# Patient Record
Sex: Male | Born: 1990 | Race: Black or African American | Hispanic: No | Marital: Single | Smoking: Never smoker
Health system: Southern US, Community
[De-identification: ages and names within clinical notes are randomized; demographics above are authoritative.]

## PROBLEM LIST (undated history)

## (undated) ENCOUNTER — Emergency Department (HOSPITAL_COMMUNITY): Admission: EM

## (undated) DIAGNOSIS — S7290XA Unspecified fracture of unspecified femur, initial encounter for closed fracture: Secondary | ICD-10-CM

## (undated) HISTORY — PX: FEMUR FRACTURE SURGERY: SHX633

---
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
## 2018-02-22 ENCOUNTER — Encounter (HOSPITAL_COMMUNITY): Payer: Self-pay | Admitting: Emergency Medicine

## 2018-02-22 ENCOUNTER — Other Ambulatory Visit: Payer: Self-pay

## 2018-02-22 DIAGNOSIS — G8911 Acute pain due to trauma: Secondary | ICD-10-CM | POA: Insufficient documentation

## 2018-02-22 DIAGNOSIS — M79652 Pain in left thigh: Secondary | ICD-10-CM | POA: Insufficient documentation

## 2018-02-22 DIAGNOSIS — M79651 Pain in right thigh: Secondary | ICD-10-CM | POA: Insufficient documentation

## 2018-02-22 DIAGNOSIS — Z8781 Personal history of (healed) traumatic fracture: Secondary | ICD-10-CM | POA: Insufficient documentation
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
## 2018-02-23 ENCOUNTER — Emergency Department (HOSPITAL_COMMUNITY)
Admission: EM | Admit: 2018-02-23 | Discharge: 2018-02-23 | Disposition: A | Discharge status: Home / Self Care | Payer: Self-pay | Attending: Emergency Medicine | Admitting: Emergency Medicine

## 2018-02-23 DIAGNOSIS — Z8781 Personal history of (healed) traumatic fracture: Secondary | ICD-10-CM

## 2018-02-23 DIAGNOSIS — G8921 Chronic pain due to trauma: Secondary | ICD-10-CM

## 2018-02-23 HISTORY — DX: Unspecified fracture of unspecified femur, initial encounter for closed fracture: S72.90XA

## 2018-02-23 MED ORDER — OXYCODONE-ACETAMINOPHEN 5-325 MG PO TABS
2.0000 | ORAL_TABLET | Freq: Once | ORAL | Status: AC
  Administered 2018-02-23: 2 via ORAL
  Filled 2018-02-23: qty 2

## 2018-02-23 MED ORDER — NAPROXEN 500 MG PO TABS: 500.0000 mg | ORAL_TABLET | Freq: Two times a day (BID) | ORAL | 0 refills | Status: AC

## 2018-02-23 MED ORDER — ACETAMINOPHEN ER 650 MG PO TBCR: 650.0000 mg | EXTENDED_RELEASE_TABLET | Freq: Three times a day (TID) | ORAL | 0 refills | Status: DC | PRN

## 2018-02-23 MED ORDER — OXYCODONE-ACETAMINOPHEN 5-325 MG PO TABS: 1.0000 | ORAL_TABLET | Freq: Three times a day (TID) | ORAL | 0 refills | Status: DC | PRN

## 2018-02-23 NOTE — Discharge Instructions (Signed)
No weight bearing for now. See the Orthopedist for further evaluation.

## 2018-02-26 NOTE — ED Provider Notes (Signed)
[facility]-EMERGENCY DEPT Provider Note   CSN: [id number] Arrival date & time: 02/22/18  2308     History   Chief Complaint Chief Complaint  Patient presents with  . Leg Pain    HPI Ryan Huerta is a 27 y.o. male.  HPI 27 year old male comes in with chief complaint of leg pain.  Patient reports that he sustained bilateral femur fracture a few weeks ago.  Patient was advised by his orthopedist to start ambulating-weightbearing as tolerated.  Patient states that now he has increasing pain with ambulation.  Patient denies any new falls or trauma.  Patient is from out of town and does not have any strong pain medications with him anymore.  Patient has tried over-the-counter pain medicine without significant relief.  Past Medical History:  Diagnosis Date  . Femur fracture (HCC)     There are no active problems to display for this patient.   Past Surgical History:  Procedure Laterality Date  . FEMUR FRACTURE SURGERY Left   . FEMUR FRACTURE SURGERY Right         Home Medications    Prior to Admission medications   Medication Sig Start Date End Date Taking? Authorizing Provider  acetaminophen (TYLENOL 8 HOUR) 650 MG CR tablet Take 1 tablet (650 mg total) by mouth every 8 (eight) hours as needed. 02/23/18   Derwood Kaplan, MD  naproxen (NAPROSYN) 500 MG tablet Take 1 tablet (500 mg total) by mouth 2 (two) times daily with a meal. 02/23/18   Derwood Kaplan, MD  oxyCODONE-acetaminophen (PERCOCET/ROXICET) 5-325 MG tablet Take 1 tablet by mouth every 8 (eight) hours as needed for severe pain. 02/23/18   Derwood Kaplan, MD    Family History History reviewed. No pertinent family history.  Social History Social History   Tobacco Use  . Smoking status: Never Smoker  . Smokeless tobacco: Never Used  Substance Use Topics  . Alcohol use: Not Currently  . Drug use: Not Currently     Allergies   Patient has no known allergies.   Review of  Systems Review of Systems  Constitutional: Positive for activity change.  Respiratory: Negative for shortness of breath.   Musculoskeletal: Positive for arthralgias and myalgias.  Hematological: Does not bruise/bleed easily.     Physical Exam Updated Vital Signs BP 127/82 (BP Location: Left Arm)   Pulse 76   Temp 98.5 F (36.9 C) (Oral)   Resp 18   SpO2 100%   Physical Exam  Constitutional: He is oriented to person, place, and time. He appears well-developed.  HENT:  Head: Atraumatic.  Neck: Neck supple.  Cardiovascular: Normal rate.  Pulmonary/Chest: Effort normal.  Musculoskeletal: He exhibits tenderness. He exhibits no edema or deformity.  Neurological: He is alert and oriented to person, place, and time.  Skin: Skin is warm.  Nursing note and vitals reviewed.   ED Treatments / Results  Labs (all labs ordered are listed, but only abnormal results are displayed) Labs Reviewed - No data to display  EKG None  Radiology No results found.  Procedures Procedures (including critical care time)  Medications Ordered in ED Medications  oxyCODONE-acetaminophen (PERCOCET/ROXICET) 5-325 MG per tablet 2 tablet (2 tablets Oral Given 02/23/18 0311)     Initial Impression / Assessment and Plan / ED Course  I have reviewed the triage vital signs and the nursing notes.  Pertinent labs & imaging results that were available during my care of the patient were reviewed by me and considered in my  medical decision making (see chart for details).     Patient comes in with bilateral leg pain.  Patient had a recent femur fracture and just started weightbearing as tolerated earlier in the week.  Now with ambulation is severe pain that is not responding to over-the-counter pain medicines.  Have advised him to follow-up with his orthopedist or the orthopedist in [place] for optimal care.  For now we will give him 6 Vicodin to go home.  Final Clinical Impressions(s) / ED Diagnoses    Final diagnoses:  History of femur fracture  Chronic pain due to injury    ED Discharge Orders        Ordered    acetaminophen (TYLENOL 8 HOUR) 650 MG CR tablet  Every 8 hours PRN     02/23/18 0309    naproxen (NAPROSYN) 500 MG tablet  2 times daily with meals     02/23/18 0309    oxyCODONE-acetaminophen (PERCOCET/ROXICET) 5-325 MG tablet  Every 8 hours PRN     02/23/18 0309       Derwood KaplanNanavati, Esteen Delpriore, MD 02/26/18 [id number]

## 2022-06-15 ENCOUNTER — Ambulatory Visit (INDEPENDENT_AMBULATORY_CARE_PROVIDER_SITE_OTHER): Payer: Self-pay | Admitting: Pharmacist

## 2022-06-15 ENCOUNTER — Other Ambulatory Visit (HOSPITAL_COMMUNITY)
Admission: RE | Admit: 2022-06-15 | Discharge: 2022-06-15 | Disposition: A | Discharge status: Home / Self Care | Payer: Self-pay | Source: Ambulatory Visit | Attending: Infectious Disease | Admitting: Infectious Disease

## 2022-06-15 ENCOUNTER — Other Ambulatory Visit: Payer: Self-pay

## 2022-06-15 DIAGNOSIS — Z202 Contact with and (suspected) exposure to infections with a predominantly sexual mode of transmission: Secondary | ICD-10-CM

## 2022-06-15 MED ORDER — DOXYCYCLINE HYCLATE 100 MG PO TABS: 100.0000 mg | ORAL_TABLET | Freq: Two times a day (BID) | ORAL | 0 refills | Status: DC

## 2022-06-15 NOTE — Progress Notes (Signed)
Date:  06/15/2022   HPI: Ryan Huerta is a 31 y.o. male who presents to the [facility] pharmacy clinic for STI testing.  Insured   []    Uninsured  [x]    There are no problems to display for this patient.   Patient's Medications  New Prescriptions   DOXYCYCLINE (VIBRA-TABS) 100 MG TABLET    Take 1 tablet (100 mg total) by mouth 2 (two) times daily.  Previous Medications   ACETAMINOPHEN (TYLENOL 8 HOUR) 650 MG CR TABLET    Take 1 tablet (650 mg total) by mouth every 8 (eight) hours as needed.   NAPROXEN (NAPROSYN) 500 MG TABLET    Take 1 tablet (500 mg total) by mouth 2 (two) times daily with a meal.   OXYCODONE-ACETAMINOPHEN (PERCOCET/ROXICET) 5-325 MG TABLET    Take 1 tablet by mouth every 8 (eight) hours as needed for severe pain.  Modified Medications   No medications on file  Discontinued Medications   No medications on file    Allergies: No Known Allergies  Past Medical History: Past Medical History:  Diagnosis Date   Femur fracture (HCC)     Social History: Social History   Socioeconomic History   Marital status: Single    Spouse name: Not on file   Number of children: Not on file   Years of education: Not on file   Highest education level: Not on file  Occupational History   Not on file  Tobacco Use   Smoking status: Never   Smokeless tobacco: Never  Substance and Sexual Activity   Alcohol use: Not Currently   Drug use: Not Currently   Sexual activity: Yes  Other Topics Concern   Not on file  Social History Narrative   Not on file   Social Determinants of Health   Financial Resource Strain: Not on file  Food Insecurity: Not on file  Transportation Needs: Not on file  Physical Activity: Not on file  Stress: Not on file  Social Connections: Not on file        No data to display          Labs:  SCr: No results found for: "CREATININE" HIV No results found for: "HIV" Hepatitis B No results found for: "HEPBSAB", "HEPBSAG",  "HEPBCAB" Hepatitis C No results found for: "HEPCAB", "HCVRNAPCRQN" Hepatitis A No results found for: "HAV" RPR and STI No results found for: "LABRPR", "RPRTITER"      No data to display          Assessment: Ryan Huerta presents to clinic today with his partner for STI testing. He states they are both exclusive at this time and that his last sexual partner was over 2 months ago. His partner recently experienced abdominal discomfort and tested positive for chlamydia. She states she is picking her prescription up today. They were sexually active yesterday, so I reviewed with them to abstain from any sexual activity until 1 week following completion of treatment. They verbalized understanding. Ryan Huerta denies any STI symptoms today. Will screen for HIV, syphilis, gonorrhea, and chlamydia. Screened patient for acute HIV symptoms such as fatigue, muscle aches, rash, sore throat, lymphadenopathy, headache, night sweats, nausea/vomiting/diarrhea, and fever. Will send in doxycycline x 7 days to [facility] for presumptive chlamydia infection. Educated [name] and his partner about PrEP which they would like to read more about before starting. Accepted condoms today. Will follow up on results and if they are interested in PrEP. He requested a call back regarding results even  if negative. Helped him set up MyChart today as well so he could see results should they result over the weekend or to message me if needed. Provided them with my card should they have more questions about any STIs or about PrEP.  Plan: Check HIV antibody, RPR, and urine/oral cytologies Start doxycycline 100mg  BID x 7 days for presumptive chlamydia Follow-up on test results   , PharmD, CPP, BCIDP Clinical Pharmacist Practitioner Infectious Diseases Clinical Pharmacist [facility] 06/15/2022, 9:48 AM

## 2022-06-16 LAB — CYTOLOGY, (ORAL, ANAL, URETHRAL) ANCILLARY ONLY
Chlamydia: NEGATIVE
Comment: NEGATIVE
Comment: NEGATIVE
Comment: NORMAL
Neisseria Gonorrhea: NEGATIVE
Trichomonas: NEGATIVE

## 2022-06-16 LAB — URINE CYTOLOGY ANCILLARY ONLY
Chlamydia: NEGATIVE
Comment: NEGATIVE
Comment: NEGATIVE
Comment: NORMAL
Neisseria Gonorrhea: NEGATIVE
Trichomonas: NEGATIVE

## 2022-06-16 LAB — HIV ANTIBODY (ROUTINE TESTING W REFLEX): HIV 1&2 Ab, 4th Generation: NONREACTIVE

## 2022-06-16 LAB — RPR: RPR Ser Ql: NONREACTIVE

## 2022-06-20 ENCOUNTER — Telehealth: Payer: Self-pay | Admitting: Pharmacist

## 2022-06-20 NOTE — Telephone Encounter (Signed)
LVM with patient stating lab results were negative and that he could call back with any questions.  Margarite Gouge, PharmD, CPP, BCIDP Clinical Pharmacist Practitioner Infectious Diseases Clinical Pharmacist [facility]

## 2022-07-06 ENCOUNTER — Emergency Department (HOSPITAL_COMMUNITY)
Admission: EM | Admit: 2022-07-06 | Discharge: 2022-07-06 | Disposition: A | Discharge status: Home / Self Care | Payer: BLUE CROSS/BLUE SHIELD | Attending: Emergency Medicine | Admitting: Emergency Medicine

## 2022-07-06 ENCOUNTER — Other Ambulatory Visit: Payer: Self-pay

## 2022-07-06 ENCOUNTER — Encounter (HOSPITAL_COMMUNITY): Payer: Self-pay

## 2022-07-06 DIAGNOSIS — K0889 Other specified disorders of teeth and supporting structures: Secondary | ICD-10-CM | POA: Diagnosis present

## 2022-07-06 MED ORDER — OXYCODONE-ACETAMINOPHEN 5-325 MG PO TABS: 1.0000 | ORAL_TABLET | Freq: Four times a day (QID) | ORAL | 0 refills | Status: DC | PRN

## 2022-07-06 MED ORDER — AMOXICILLIN-POT CLAVULANATE 875-125 MG PO TABS: 1.0000 | ORAL_TABLET | Freq: Two times a day (BID) | ORAL | 0 refills | Status: DC

## 2022-07-06 NOTE — Discharge Instructions (Addendum)
You were seen in the emergency department for dental pain.  As we discussed, you may be developing an infection around the tooth with your filling. I am giving you some pain medication and antibiotics. You will need to follow up with a dentist and I've attached a list of some in the area.

## 2022-07-06 NOTE — ED Triage Notes (Signed)
Reports left upper molar pain that started x 1 week.  Reports a filling in that tooth.  Reports it is now causing his lower teeth to hurt.

## 2022-07-07 NOTE — ED Provider Notes (Signed)
[facility] EMERGENCY DEPARTMENT Provider Note   CSN: [id number] Arrival date & time: 07/06/22  1549     History  Chief Complaint  Patient presents with   Dental Pain    Ryan Huerta is a 31 y.o. male who presents the emergency department for dental pain for the past week.  Patient reports pain along his left upper molar at the site of previous filling.  He states that the pain is often causing his left face as well as left lower teeth to hurt.  Has not been taking any medication for this.  Last saw dentist last year.  Does not have insurance, but is interested in finding a dentist in the area.   Dental Pain Associated symptoms: no fever        Home Medications Prior to Admission medications   Medication Sig Start Date End Date Taking? Authorizing Provider  amoxicillin-clavulanate (AUGMENTIN) 875-125 MG tablet Take 1 tablet by mouth every 12 (twelve) hours. 07/06/22  Yes Siyona Coto T, PA-C  oxyCODONE-acetaminophen (PERCOCET/ROXICET) 5-325 MG tablet Take 1 tablet by mouth every 6 (six) hours as needed for severe pain. 07/06/22  Yes Marrell Dicaprio T, PA-C  acetaminophen (TYLENOL 8 HOUR) 650 MG CR tablet Take 1 tablet (650 mg total) by mouth every 8 (eight) hours as needed. 02/23/18   Derwood Kaplan, MD  doxycycline (VIBRA-TABS) 100 MG tablet Take 1 tablet (100 mg total) by mouth 2 (two) times daily. 06/15/22   Jennette Kettle, RPH-CPP  naproxen (NAPROSYN) 500 MG tablet Take 1 tablet (500 mg total) by mouth 2 (two) times daily with a meal. 02/23/18   Derwood Kaplan, MD      Allergies    Patient has no known allergies.    Review of Systems   Review of Systems  Constitutional:  Negative for chills and fever.  HENT:  Positive for dental problem. Negative for sore throat and trouble swallowing.   All other systems reviewed and are negative.   Physical Exam Updated Vital Signs BP 117/79 (BP Location: Right Arm)   Pulse 87   Temp 98.1 F (36.7 C)  (Oral)   Resp 16   Ht 6\' 4"  (1.93 m)   Wt 70.3 kg   SpO2 99%   BMI 18.87 kg/m  Physical Exam Vitals and nursing note reviewed.  Constitutional:      Appearance: Normal appearance.  HENT:     Head: Normocephalic and atraumatic.     Mouth/Throat:     Comments: Filling on the left upper molar. Difficult to visualize integrity of filling. Mild erythema of surrounding gingiva without dental abscess.  No sublingual swelling, or PTA.  No oropharyngeal erythema or edema. Eyes:     Conjunctiva/sclera: Conjunctivae normal.  Pulmonary:     Effort: Pulmonary effort is normal. No respiratory distress.  Skin:    General: Skin is warm and dry.  Neurological:     Mental Status: He is alert.  Psychiatric:        Mood and Affect: Mood normal.        Behavior: Behavior normal.     ED Results / Procedures / Treatments   Labs (all labs ordered are listed, but only abnormal results are displayed) Labs Reviewed - No data to display  EKG None  Radiology No results found.  Procedures Procedures    Medications Ordered in ED Medications - No data to display  ED Course/ Medical Decision Making/ A&P  Medical Decision Making Risk Prescription drug management.   Patient is a otherwise healthy 32 year old male who presents the emergency department complaining of dental pain x1 week.  On exam patient is clinically well-appearing.  Afebrile.  Does have a filling on the left upper molar, that is difficult to visualize the integrity of.  Mild erythema of the surrounding gingiva, without abscess.  No PTA or sublingual swelling.  Do not believe patient's requiring further evaluation for her symptoms at this time.  Will discharged home with antibiotics, pain medication, and given resource guide for dentists.  Patient discharged in stable condition and all questions answered.        Final Clinical Impression(s) / ED Diagnoses Final diagnoses:  Pain, dental     Rx / DC Orders ED Discharge Orders          Ordered    oxyCODONE-acetaminophen (PERCOCET/ROXICET) 5-325 MG tablet  Every 6 hours PRN        07/06/22 1637    amoxicillin-clavulanate (AUGMENTIN) 875-125 MG tablet  Every 12 hours        07/06/22 1637           Portions of this report may have been transcribed using voice recognition software. Every effort was made to ensure accuracy; however, inadvertent computerized transcription errors may be present.    Jeanella Flattery 07/07/22 1851    Glyn Ade, MD 07/07/22 2227

## 2022-07-31 ENCOUNTER — Other Ambulatory Visit: Payer: Self-pay

## 2022-07-31 ENCOUNTER — Emergency Department (HOSPITAL_COMMUNITY)
Admission: EM | Admit: 2022-07-31 | Discharge: 2022-07-31 | Disposition: A | Discharge status: Home / Self Care | Payer: BLUE CROSS/BLUE SHIELD | Attending: Emergency Medicine | Admitting: Emergency Medicine

## 2022-07-31 DIAGNOSIS — K0889 Other specified disorders of teeth and supporting structures: Secondary | ICD-10-CM | POA: Diagnosis present

## 2022-07-31 MED ORDER — AMOXICILLIN-POT CLAVULANATE 875-125 MG PO TABS
1.0000 | ORAL_TABLET | Freq: Once | ORAL | Status: AC
  Administered 2022-07-31: 1 via ORAL
  Filled 2022-07-31: qty 1

## 2022-07-31 MED ORDER — CHLORHEXIDINE GLUCONATE 0.12 % MT SOLN: 15.0000 mL | Freq: Two times a day (BID) | OROMUCOSAL | 0 refills | Status: AC

## 2022-07-31 MED ORDER — KETOROLAC TROMETHAMINE 15 MG/ML IJ SOLN
15.0000 mg | Freq: Once | INTRAMUSCULAR | Status: AC
  Administered 2022-07-31: 15 mg via INTRAMUSCULAR
  Filled 2022-07-31: qty 1

## 2022-07-31 MED ORDER — HYDROCODONE-ACETAMINOPHEN 5-325 MG PO TABS: 1.0000 | ORAL_TABLET | Freq: Four times a day (QID) | ORAL | 0 refills | Status: AC | PRN

## 2022-07-31 MED ORDER — HYDROCODONE-ACETAMINOPHEN 5-325 MG PO TABS
1.0000 | ORAL_TABLET | Freq: Once | ORAL | Status: AC
  Administered 2022-07-31: 1 via ORAL
  Filled 2022-07-31: qty 1

## 2022-07-31 MED ORDER — AMOXICILLIN-POT CLAVULANATE 875-125 MG PO TABS: 1.0000 | ORAL_TABLET | Freq: Two times a day (BID) | ORAL | 0 refills | Status: AC

## 2022-07-31 NOTE — ED Triage Notes (Signed)
Pt reports continued dental pain, unresolved since being seen for same at end of August. Has completed abx and pain rx given at that time without relief. Has not been to see a dentist.

## 2022-07-31 NOTE — ED Provider Triage Note (Signed)
Emergency Medicine Provider Triage Evaluation Note  Ryan Huerta , a 31 y.o. male  was evaluated in triage.  Pt complains of left-sided dental pain.  Has been ongoing for quite some time.  Was given antibiotics and pain medicine at prior visit.  Patient states antibiotics did not help over the pain medicine did.  He is not followed by dentistry.  No facial swelling.  No chest pain or shortness of breath.  Review of Systems  Positive: Dental pain Negative: Fever, facial swelling  Physical Exam  BP 114/82   Pulse 92   Temp 98.7 F (37.1 C) (Oral)   Resp 16   SpO2 96%  Gen:   Awake, no distress   Resp:  Normal effort  MSK:   Moves extremities without difficulty  Other:    Medical Decision Making  Medically screening exam initiated at 5:17 PM.  Appropriate orders placed.  DEQUANTE TREMAINE was informed that the remainder of the evaluation will be completed by another provider, this initial triage assessment does not replace that evaluation, and the importance of remaining in the ED until their evaluation is complete.  Dental pain   Michela Herst A, PA-C 07/31/22 1717

## 2022-07-31 NOTE — ED Notes (Signed)
RN reviewed discharge instructions with pt. Pt verbalized understanding and had no further questions. VSS upon disharge

## 2022-07-31 NOTE — ED Provider Notes (Signed)
[facility] EMERGENCY DEPARTMENT Provider Note   CSN: [id number] Arrival date & time: 07/31/22  1610     History  Chief Complaint  Patient presents with   Dental Pain    Ryan Huerta is a 31 y.o. male.  HPI Patient presents with concern of pain in his left upper jawline and face.  Patient has had a degree of pain in that area for some time, has a known broken tooth, was seen, evaluated about 1 month ago in our affiliated facility, and notes that his attempt follow-up with dentistry was incomplete.  With past 2 days he has had increasing pain in the area without fever, vomiting, or other complaints, minimal relief with Tylenol.    Home Medications Prior to Admission medications   Medication Sig Start Date End Date Taking? Authorizing Provider  chlorhexidine (PERIDEX) 0.12 % solution Use as directed 15 mLs in the mouth or throat 2 (two) times daily. 07/31/22  Yes Carmin Muskrat, MD  HYDROcodone-acetaminophen (NORCO/VICODIN) 5-325 MG tablet Take 1 tablet by mouth every 6 (six) hours as needed. 07/31/22  Yes Carmin Muskrat, MD  amoxicillin-clavulanate (AUGMENTIN) 875-125 MG tablet Take 1 tablet by mouth every 12 (twelve) hours for 5 days. 07/31/22 08/05/22  Carmin Muskrat, MD  naproxen (NAPROSYN) 500 MG tablet Take 1 tablet (500 mg total) by mouth 2 (two) times daily with a meal. 02/23/18   Varney Biles, MD      Allergies    Patient has no known allergies.    Review of Systems   Review of Systems  All other systems reviewed and are negative.   Physical Exam Updated Vital Signs BP 102/71   Pulse 63   Temp 98 F (36.7 C)   Resp 16   SpO2 100%  Physical Exam Vitals and nursing note reviewed.  Constitutional:      Appearance: Normal appearance.  HENT:     Head: Normocephalic and atraumatic.     Mouth/Throat:     Comments: Filling on the left upper molar.  Mild surrounding erythema, no purulence, no bleeding, no obvious abscess.  Oropharynx  otherwise unremarkable. Eyes:     Conjunctiva/sclera: Conjunctivae normal.  Pulmonary:     Effort: Pulmonary effort is normal. No respiratory distress.  Skin:    General: Skin is warm and dry.  Neurological:     Mental Status: He is alert.  Psychiatric:        Mood and Affect: Mood normal.        Behavior: Behavior normal.     ED Results / Procedures / Treatments   Labs (all labs ordered are listed, but only abnormal results are displayed) Labs Reviewed - No data to display  EKG None  Radiology No results found.  Procedures Procedures    Medications Ordered in ED Medications  amoxicillin-clavulanate (AUGMENTIN) 875-125 MG per tablet 1 tablet (has no administration in time range)  ketorolac (TORADOL) 15 MG/ML injection 15 mg (has no administration in time range)  HYDROcodone-acetaminophen (NORCO/VICODIN) 5-325 MG per tablet 1 tablet (has no administration in time range)    ED Course/ Medical Decision Making/ A&P                           Medical Decision Making Patient, generally well-appearing presents with left upper mouth and face pain concerning for dental pain/infection.  No evidence for bacteremia, sepsis, no evidence for deep space infection with no asymmetry of the oropharynx.  Patient  received IM and oral medications, antibiotics, was discharged with oral similar meds, to follow-up with dentist.  Amount and/or Complexity of Data Reviewed External Data Reviewed: notes.    Details: Prior note 1 month ago similar to today's physical exam  Risk Prescription drug management.  Final Clinical Impression(s) / ED Diagnoses Final diagnoses:  Pain, dental    Rx / DC Orders ED Discharge Orders          Ordered    amoxicillin-clavulanate (AUGMENTIN) 875-125 MG tablet  Every 12 hours        07/31/22 2200    HYDROcodone-acetaminophen (NORCO/VICODIN) 5-325 MG tablet  Every 6 hours PRN        07/31/22 2200    chlorhexidine (PERIDEX) 0.12 % solution  2 times daily         07/31/22 2200              Carmin Muskrat, MD 07/31/22 2212

## 2022-07-31 NOTE — Discharge Instructions (Addendum)
Please be sure to use the provided resources and call our dental colleagues tomorrow.  Return here for concerning changes in your condition.

## 2023-05-14 ENCOUNTER — Ambulatory Visit (HOSPITAL_COMMUNITY): Payer: BLUE CROSS/BLUE SHIELD

## 2023-05-15 ENCOUNTER — Ambulatory Visit (HOSPITAL_COMMUNITY): Payer: BLUE CROSS/BLUE SHIELD

## 2023-05-17 ENCOUNTER — Inpatient Hospital Stay: Admission: RE | Admit: 2023-05-17 | Payer: BLUE CROSS/BLUE SHIELD | Source: Ambulatory Visit

## 2023-05-17 ENCOUNTER — Ambulatory Visit: Payer: BLUE CROSS/BLUE SHIELD

## 2023-05-21 ENCOUNTER — Ambulatory Visit: Payer: BLUE CROSS/BLUE SHIELD

## 2023-05-22 ENCOUNTER — Ambulatory Visit
Admission: RE | Admit: 2023-05-22 | Discharge: 2023-05-22 | Disposition: A | Discharge status: Home / Self Care | Payer: BLUE CROSS/BLUE SHIELD | Source: Ambulatory Visit | Attending: Internal Medicine | Admitting: Internal Medicine

## 2023-05-22 ENCOUNTER — Ambulatory Visit (HOSPITAL_COMMUNITY): Payer: BLUE CROSS/BLUE SHIELD

## 2023-05-22 ENCOUNTER — Ambulatory Visit: Payer: BLUE CROSS/BLUE SHIELD

## 2023-05-22 VITALS — BP 109/73 | HR 78 | Temp 98.3°F | Resp 16

## 2023-05-22 DIAGNOSIS — Z113 Encounter for screening for infections with a predominantly sexual mode of transmission: Secondary | ICD-10-CM | POA: Insufficient documentation

## 2023-05-22 DIAGNOSIS — R238 Other skin changes: Secondary | ICD-10-CM | POA: Insufficient documentation

## 2023-05-22 DIAGNOSIS — Z202 Contact with and (suspected) exposure to infections with a predominantly sexual mode of transmission: Secondary | ICD-10-CM | POA: Insufficient documentation

## 2023-05-22 DIAGNOSIS — R369 Urethral discharge, unspecified: Secondary | ICD-10-CM | POA: Diagnosis not present

## 2023-05-22 MED ORDER — DOXYCYCLINE HYCLATE 100 MG PO TBEC: 100.0000 mg | DELAYED_RELEASE_TABLET | Freq: Two times a day (BID) | ORAL | 0 refills | Status: AC

## 2023-05-22 MED ORDER — METRONIDAZOLE 500 MG PO TABS: 500.0000 mg | ORAL_TABLET | Freq: Two times a day (BID) | ORAL | 0 refills | Status: AC

## 2023-05-22 NOTE — ED Triage Notes (Signed)
Pt c/o penile d/c first noticed last night-blister to right groin x 1 week-reports exposure to multiple STD-NAD-steady gait

## 2023-05-22 NOTE — Discharge Instructions (Addendum)
We will call you if any of your test come back positive.

## 2023-05-22 NOTE — ED Provider Notes (Signed)
[facility]    CSN: [id number] Arrival date & time: 05/22/23  1344      History   Chief Complaint Chief Complaint  Patient presents with   Blister    i believe i have discharge - Entered by patient   Penile Discharge    HPI Ryan Huerta is a 32 y.o.  male who presents with noticing penile discharge last night, and red spot on his R groing one week ago and has applied antibiotic ointment and is better today. Has been exposed to a male who tested positive for Chlamydia, trich and BV. He has been dating her since April '24 and decided in June to not wear a condom. He had negative STD test 06/2022 and has not had a sexual partner since.     Past Medical History:  Diagnosis Date   Femur fracture (HCC)     There are no problems to display for this patient.   Past Surgical History:  Procedure Laterality Date   FEMUR FRACTURE SURGERY Left    FEMUR FRACTURE SURGERY Right        Home Medications    Prior to Admission medications   Medication Sig Start Date End Date Taking? Authorizing Provider  doxycycline (DORYX) 100 MG EC tablet Take 1 tablet (100 mg total) by mouth 2 (two) times daily. 05/22/23  Yes Rodriguez-Southworth, Nettie Elm, PA-C  metroNIDAZOLE (FLAGYL) 500 MG tablet Take 1 tablet (500 mg total) by mouth 2 (two) times daily. 05/22/23  Yes Rodriguez-Southworth, Nettie Elm, PA-C  chlorhexidine (PERIDEX) 0.12 % solution Use as directed 15 mLs in the mouth or throat 2 (two) times daily. 07/31/22   Gerhard Munch, MD  HYDROcodone-acetaminophen (NORCO/VICODIN) 5-325 MG tablet Take 1 tablet by mouth every 6 (six) hours as needed. 07/31/22   Gerhard Munch, MD  naproxen (NAPROSYN) 500 MG tablet Take 1 tablet (500 mg total) by mouth 2 (two) times daily with a meal. 02/23/18   Derwood Kaplan, MD    Family History History reviewed. No pertinent family history.  Social History Social History   Tobacco Use   Smoking status: Never   Smokeless tobacco: Never  Vaping  Use   Vaping status: Never Used  Substance Use Topics   Alcohol use: Not Currently   Drug use: Yes    Types: Marijuana     Allergies   Patient has no known allergies.   Review of Systems Review of Systems As noted in HPI  Physical Exam Triage Vital Signs ED Triage Vitals  Encounter Vitals Group     BP 05/22/23 1353 109/73     Systolic BP Percentile --      Diastolic BP Percentile --      Pulse Rate 05/22/23 1353 78     Resp 05/22/23 1353 16     Temp 05/22/23 1353 98.3 F (36.8 C)     Temp Source 05/22/23 1353 Oral     SpO2 05/22/23 1353 95 %     Weight --      Height --      Head Circumference --      Peak Flow --      Pain Score 05/22/23 1351 0     Pain Loc --      Pain Education --      Exclude from Growth Chart --    No data found.  Updated Vital Signs BP 109/73 (BP Location: Right Arm)   Pulse 78   Temp 98.3 F (36.8 C) (Oral)  Resp 16   SpO2 95%   Visual Acuity Right Eye Distance:   Left Eye Distance:   Bilateral Distance:    Right Eye Near:   Left Eye Near:    Bilateral Near:     Physical Exam Constitutional:      General: He is not in acute distress.    Appearance: He is normal weight.  HENT:     Right Ear: External ear normal.     Left Ear: External ear normal.  Eyes:     Conjunctiva/sclera: Conjunctivae normal.  Pulmonary:     Effort: Pulmonary effort is normal.  Genitourinary:    Penis: Normal.   Skin:    General: Skin is warm and dry.     Comments: Has pink coloration skin on R groin area, but no open sores, blisters or lesion noted. It looks irritated and I could not do a herpes culture since there is no matter draining or open wound.   Neurological:     General: No focal deficit present.     Mental Status: He is alert and oriented to person, place, and time.  Psychiatric:        Mood and Affect: Mood normal.        Behavior: Behavior normal.        Thought Content: Thought content normal.        Judgment: Judgment normal.       UC Treatments / Results  Labs (all labs ordered are listed, but only abnormal results are displayed) Labs Reviewed  RPR  HIV ANTIBODY (ROUTINE TESTING W REFLEX)  CYTOLOGY, (ORAL, ANAL, URETHRAL) ANCILLARY ONLY    EKG   Radiology No results found.  Procedures Procedures (including critical care time)  Medications Ordered in UC Medications - No data to display  Initial Impression / Assessment and Plan / UC Course  I have reviewed the triage vital signs and the nursing notes.  STD exposure  I placed him on Doxy and Flagyl as noted I ordered HIV, GC, trich and Chlamydia and we will inform him if positive Final Clinical Impressions(s) / UC Diagnoses   Final diagnoses:  Exposure to chlamydia  Penile discharge  Screen for STD (sexually transmitted disease)  Skin irritation     Discharge Instructions      We will call you if any of your test come back positive     ED Prescriptions     Medication Sig Dispense Auth. Provider   doxycycline (DORYX) 100 MG EC tablet Take 1 tablet (100 mg total) by mouth 2 (two) times daily. 20 tablet Rodriguez-Southworth, Nettie Elm, PA-C   metroNIDAZOLE (FLAGYL) 500 MG tablet Take 1 tablet (500 mg total) by mouth 2 (two) times daily. 14 tablet Rodriguez-Southworth, Nettie Elm, PA-C      PDMP not reviewed this encounter.   Garey Ham, PA-C 05/22/23 1422

## 2023-05-23 LAB — CYTOLOGY, (ORAL, ANAL, URETHRAL) ANCILLARY ONLY
Chlamydia: POSITIVE — AB
Comment: NEGATIVE
Comment: NEGATIVE
Comment: NORMAL
Neisseria Gonorrhea: NEGATIVE
Trichomonas: POSITIVE — AB

## 2023-05-23 LAB — RPR: RPR Ser Ql: NONREACTIVE

## 2023-05-23 LAB — HIV ANTIBODY (ROUTINE TESTING W REFLEX): HIV Screen 4th Generation wRfx: NONREACTIVE

## 2023-12-28 ENCOUNTER — Emergency Department (HOSPITAL_COMMUNITY)
Admission: EM | Admit: 2023-12-28 | Discharge: 2023-12-28 | Discharge status: Against Medical Advice | Payer: BLUE CROSS/BLUE SHIELD

## 2023-12-28 NOTE — ED Notes (Signed)
Pt stated that he is leaving because the wait is long and it is only his tooth. Pt seen leaving the ED

## 2024-02-01 ENCOUNTER — Ambulatory Visit

## 2024-02-13 ENCOUNTER — Ambulatory Visit

## 2024-02-17 ENCOUNTER — Ambulatory Visit

## 2024-02-20 ENCOUNTER — Encounter (HOSPITAL_COMMUNITY): Payer: Self-pay

## 2024-02-20 ENCOUNTER — Emergency Department (HOSPITAL_COMMUNITY): Admission: EM | Admit: 2024-02-20 | Discharge: 2024-02-20 | Disposition: A | Discharge status: Home / Self Care

## 2024-02-20 ENCOUNTER — Other Ambulatory Visit: Payer: Self-pay

## 2024-02-20 ENCOUNTER — Emergency Department (HOSPITAL_COMMUNITY)

## 2024-02-20 DIAGNOSIS — Y92410 Unspecified street and highway as the place of occurrence of the external cause: Secondary | ICD-10-CM | POA: Insufficient documentation

## 2024-02-20 DIAGNOSIS — M79644 Pain in right finger(s): Secondary | ICD-10-CM | POA: Insufficient documentation

## 2024-02-20 DIAGNOSIS — M25562 Pain in left knee: Secondary | ICD-10-CM | POA: Insufficient documentation

## 2024-02-20 DIAGNOSIS — M25552 Pain in left hip: Secondary | ICD-10-CM | POA: Insufficient documentation

## 2024-02-20 MED ORDER — NAPROXEN 250 MG PO TABS
500.0000 mg | ORAL_TABLET | Freq: Once | ORAL | Status: AC
  Administered 2024-02-20: 500 mg via ORAL
  Filled 2024-02-20: qty 2
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
## 2024-02-20 NOTE — Discharge Instructions (Signed)
 Thank you for letting us  evaluate you today.  Your x-rays of your left hip, left knee, right finger were all negative for fractures or dislocations.  Sent naproxen to your pharmacy to use as needed for pain. You may use naproxen and Tylenol intermittently every 8 hours as needed for pain.  Please do not use naproxen with aspirin, Aleve, ibuprofen, Advil as they are all in the same family.

## 2024-04-07 NOTE — Progress Notes (Signed)
 NEW REFERRAL TO [facility]    HPI: Ryan Huerta is a 33 y.o. male who presents to the [facility] clinic today for STI testing.  There are no active problems to display for this patient.   Patient's Medications  New Prescriptions   No medications on file  Previous Medications   CHLORHEXIDINE  (PERIDEX ) 0.12 % SOLUTION    Use as directed 15 mLs in the mouth or throat 2 (two) times daily.   DOXYCYCLINE  (DORYX ) 100 MG EC TABLET    Take 1 tablet (100 mg total) by mouth 2 (two) times daily.   HYDROCODONE -ACETAMINOPHEN  (NORCO/VICODIN) 5-325 MG TABLET    Take 1 tablet by mouth every 6 (six) hours as needed.   METRONIDAZOLE  (FLAGYL ) 500 MG TABLET    Take 1 tablet (500 mg total) by mouth 2 (two) times daily.   NAPROXEN  (NAPROSYN ) 500 MG TABLET    Take 1 tablet (500 mg total) by mouth 2 (two) times daily with a meal.  Modified Medications   No medications on file  Discontinued Medications   No medications on file    Assessment: Ryan Huerta is here today for STI testing. Last seen by Mylinda Asa in August 2023 for potential chlamydia exposure but results were negative. Last STI testing was in July 2024 and was positive for chlamydia and trichomonas. He was treated appropriately for this. Comes in today after having a few new male partners. He uses protection most of the time but did not with oral sex. He is not having any symptoms and does not know of any exposures from his partners. He is requesting full testing today.   Briefly discussed HIV PrEP, but he is not interested as he usually does not have more than one partner. Asked him to let us  know if he changes his mind. Will test for HIV, gonorrhea, chlamydia, trichomonas, and syphilis. Will also order a Hepatitis serology panel. Will call him once results return.  Plan: - HIV antibody, RPR, and urine/oral tests for GC/CT/trich  - Hepatitis A antibody; Hepatitis B sAg, cAb, and sAb; Hepatitis C antibody - F/u results to see if treatment is needed  Leah Skora  L. Edie Darley, PharmD, BCIDP, AAHIVP, CPP Clinical Pharmacist Practitioner - Infectious Diseases Clinical Pharmacist Lead - Specialty Pharm[facility] 04/07/2024, 3:30 PM

## 2024-04-08 ENCOUNTER — Other Ambulatory Visit: Payer: Self-pay

## 2024-04-08 ENCOUNTER — Ambulatory Visit (INDEPENDENT_AMBULATORY_CARE_PROVIDER_SITE_OTHER): Payer: Self-pay | Admitting: Pharmacist

## 2024-04-08 DIAGNOSIS — Z113 Encounter for screening for infections with a predominantly sexual mode of transmission: Secondary | ICD-10-CM

## 2024-04-09 LAB — GC/CHLAMYDIA PROBE, AMP (THROAT)
Chlamydia trachomatis RNA: NOT DETECTED
Neisseria gonorrhoeae RNA: NOT DETECTED

## 2024-04-09 LAB — HEPATITIS A ANTIBODY, TOTAL: Hepatitis A AB,Total: NONREACTIVE

## 2024-04-09 LAB — C. TRACHOMATIS/N. GONORRHOEAE RNA
C. trachomatis RNA, TMA: NOT DETECTED
N. gonorrhoeae RNA, TMA: NOT DETECTED

## 2024-04-09 LAB — HEPATITIS B SURFACE ANTIBODY,QUALITATIVE: Hep B S Ab: NONREACTIVE

## 2024-04-09 LAB — HEPATITIS B CORE ANTIBODY, TOTAL: Hep B Core Total Ab: NONREACTIVE

## 2024-04-09 LAB — HEPATITIS C ANTIBODY: Hepatitis C Ab: NONREACTIVE

## 2024-04-09 LAB — RPR: RPR Ser Ql: NONREACTIVE

## 2024-04-09 LAB — TRICHOMONAS VAGINALIS RNA, QL,MALES: Trichomonas vaginalis RNA: NOT DETECTED

## 2024-04-09 LAB — HEPATITIS B SURFACE ANTIGEN: Hepatitis B Surface Ag: NONREACTIVE

## 2024-04-09 LAB — HIV ANTIBODY (ROUTINE TESTING W REFLEX): HIV 1&2 Ab, 4th Generation: NONREACTIVE

## 2024-10-24 ENCOUNTER — Emergency Department (HOSPITAL_COMMUNITY)

## 2024-10-24 ENCOUNTER — Emergency Department (HOSPITAL_COMMUNITY): Admission: EM | Admit: 2024-10-24 | Discharge: 2024-10-24 | Disposition: A | Discharge status: Home / Self Care

## 2024-10-24 ENCOUNTER — Encounter (HOSPITAL_COMMUNITY): Payer: Self-pay | Admitting: Emergency Medicine

## 2024-10-24 DIAGNOSIS — J101 Influenza due to other identified influenza virus with other respiratory manifestations: Secondary | ICD-10-CM | POA: Diagnosis not present

## 2024-10-24 DIAGNOSIS — R0602 Shortness of breath: Secondary | ICD-10-CM | POA: Diagnosis present

## 2024-10-24 LAB — RESP PANEL BY RT-PCR (RSV, FLU A&B, COVID)  RVPGX2
Influenza A by PCR: POSITIVE — AB
Influenza B by PCR: NEGATIVE
Resp Syncytial Virus by PCR: NEGATIVE
SARS Coronavirus 2 by RT PCR: NEGATIVE

## 2024-10-24 MED ORDER — IBUPROFEN 400 MG PO TABS
400.0000 mg | ORAL_TABLET | Freq: Once | ORAL | Status: AC
  Administered 2024-10-24: 400 mg via ORAL
  Filled 2024-10-24: qty 1

## 2024-10-24 MED ORDER — ACETAMINOPHEN 325 MG PO TABS
650.0000 mg | ORAL_TABLET | Freq: Four times a day (QID) | ORAL | Status: DC | PRN
  Filled 2024-10-24: qty 2

## 2024-10-24 NOTE — ED Triage Notes (Signed)
 Pt here from home with c/o cough  and body aches and maybe fevers at home

## 2024-10-24 NOTE — ED Provider Triage Note (Signed)
 Emergency Medicine Provider Triage Evaluation Note  Ryan Huerta , a 33 y.o. male  was evaluated in triage.  Pt complains of headache, cough, and diarrhea.  The patient states that he has been having a headache on the left side now for the past 2 days.  States this came on gradually is gradually worsened.  He states that he is coughing and this has been going on since November.  He states that he is coughing up some mucus with this.  He denies any focal weakness, numbness, or tingling.  Denies any abdominal pain but has had some diarrhea as well this been going on over the past couple of days.  Reports that he never gets headaches and this is out of the ordinary for him.  Review of Systems  Positive: See above Negative: Chest pain  Physical Exam  BP 113/74 (BP Location: Right Arm)   Pulse 84   Temp 98.1 F (36.7 C)   Resp 16   SpO2 99%  Gen:   Awake, no distress   Resp:  Normal effort, no wheezing noted MSK:   Moves extremities without difficulty  Other:  No focal deficits on neuro exam, no meningismus  Medical Decision Making  Medically screening exam initiated at 9:31 AM.  Appropriate orders placed.  ULES MARSALA was informed that the remainder of the evaluation will be completed by another provider, this initial triage assessment does not replace that evaluation, and the importance of remaining in the ED until their evaluation is complete.  Patient reports cough now for over 1 month.  Will obtain x-ray here to evaluate for pneumonia or other abnormalities.  Given headache may be due to viral syndrome but will obtain CT scan to evaluate for mass lesion as the patient states that he does not get headaches very often.  With the diarrhea and the headache with this may be due to viral etiology.  Will give him Toradol  for headache and reevaluate.   Ula Prentice SAUNDERS, MD 10/24/24 [phone]

## 2024-10-24 NOTE — ED Provider Notes (Signed)
 " [place] [facility] AT [facility] Provider Note   CSN: [id number] Arrival date & time: 10/24/24  9151     Patient presents with: Cough and Shortness of Breath   Ryan Huerta is a 33 y.o. male presents to the ED with flulike symptoms that began 4 days ago. The symptoms started as a cough and congestion and have been progressive since onset. The patient describes the symptoms as productive cough, headache, and diarrhea.  Patient denies any nausea or vomiting.  Patient denies any chest pain or shortness of breath.  Patient reports subjective fevers at home.  Patient describes the headache as left-sided, wax/wane in nature, without other neurological symptoms such as numbness, tingling, or weakness. No recent travel. No sick contacts.  Patient is in no acute distress.    Cough Associated symptoms: shortness of breath   Shortness of Breath Associated symptoms: cough        Prior to Admission medications  Medication Sig Start Date End Date Taking? Authorizing Provider  chlorhexidine  (PERIDEX ) 0.12 % solution Use as directed 15 mLs in the mouth or throat 2 (two) times daily. 07/31/22   Garrick Charleston, MD  doxycycline  (DORYX ) 100 MG EC tablet Take 1 tablet (100 mg total) by mouth 2 (two) times daily. 05/22/23   Rodriguez-Southworth, Sylvia, PA-C  HYDROcodone -acetaminophen  (NORCO/VICODIN) 5-325 MG tablet Take 1 tablet by mouth every 6 (six) hours as needed. 07/31/22   Garrick Charleston, MD  metroNIDAZOLE  (FLAGYL ) 500 MG tablet Take 1 tablet (500 mg total) by mouth 2 (two) times daily. 05/22/23   Rodriguez-Southworth, Sylvia, PA-C  naproxen  (NAPROSYN ) 500 MG tablet Take 1 tablet (500 mg total) by mouth 2 (two) times daily with a meal. 02/23/18   Charlyn Sora, MD    Allergies: Patient has no known allergies.    Review of Systems  Respiratory:  Positive for cough and shortness of breath.     Updated Vital Signs BP 113/74 (BP Location: Right Arm)   Pulse 84   Temp  98.1 F (36.7 C)   Resp 16   SpO2 99%   Physical Exam Vitals and nursing note reviewed.  Constitutional:      General: He is not in acute distress.    Appearance: Normal appearance.  HENT:     Head: Normocephalic and atraumatic.     Right Ear: Tympanic membrane and ear canal normal.     Left Ear: Tympanic membrane and ear canal normal.     Nose: Congestion and rhinorrhea present. Rhinorrhea is clear.     Mouth/Throat:     Mouth: Mucous membranes are moist.     Pharynx: Posterior oropharyngeal erythema present. No pharyngeal swelling or oropharyngeal exudate.     Tonsils: No tonsillar abscesses.  Eyes:     Extraocular Movements: Extraocular movements intact.     Conjunctiva/sclera: Conjunctivae normal.     Pupils: Pupils are equal, round, and reactive to light.  Cardiovascular:     Rate and Rhythm: Normal rate and regular rhythm.     Pulses: Normal pulses.  Pulmonary:     Effort: Pulmonary effort is normal. No respiratory distress.     Comments: He has no difficulty speaking complete sentences. Abdominal:     General: Abdomen is flat.     Palpations: Abdomen is soft.     Tenderness: There is no abdominal tenderness.  Musculoskeletal:        General: Normal range of motion.     Cervical back: Normal range of motion.  Skin:    General: Skin is warm and dry.     Capillary Refill: Capillary refill takes less than 2 seconds.  Neurological:     General: No focal deficit present.     Mental Status: He is alert. Mental status is at baseline.     Comments: Patient alert and oriented.  Speech clear and appropriate.  No aphasia or dysarthria.   Cranial nerves III through XII intact: Motor strength 5-5 in all extremities with normal tone and no pronator drift.   Sensation intact to light touch in upper and lower extremities bilaterally.  No focal neurologic deficits appreciated.   Psychiatric:        Mood and Affect: Mood normal.     (all labs ordered are listed, but only  abnormal results are displayed) Labs Reviewed  RESP PANEL BY RT-PCR (RSV, FLU A&B, COVID)  [id number] - Abnormal; Notable for the following components:      Result Value   Influenza A by PCR POSITIVE (*)    All other components within normal limits    EKG: None  Radiology: No results found.    Procedures   Medications Ordered in the ED  ibuprofen  (ADVIL ) tablet 400 mg (400 mg Oral Given 10/24/24 1208)                                 Medical Decision Making  Patient presents to the ED for: Flulike symptoms This involves an extensive number of treatment options  Differential diagnosis includes:  Infectious etiology Neurologic etiology -worsening headache Co-morbid conditions: None  Clinical Course as of 10/28/24 0844  Fri Oct 24, 2024  1230 Resp panel by RT-PCR (RSV, Flu A&B, Covid) Anterior Nasal Swab(!) Influenza A positive [ML]  1230 DG Chest 1 View No acute findings [ML]  1230 CT Head Wo Contrast No acute findings [ML]  1230 Temp: 98.1 F (36.7 C) Afebrile, vital stable, patient no acute distress [ML]    Clinical Course User Index [ML] Willma Duwaine CROME, PA    Data Reviewed / Actions Taken: Labs ordered/reviewed with my independent interpretation in ED course above. Imaging ordered/reviewed with my independent interpretation in ED course above. I agree with the radiologists interpretation.   Management / Treatments: Ibuprofen  administered in triage - well tolerated with symptomatic relief. I have reviewed the patients home medicines and have made adjustments as needed  Test Considered/Diagnostic tools:  Additional diagnostic testing such as additional laboratory testing was considered, ordered, based on the patients presenting symptoms and initial clinical assessment they were deemed not necessary at this time.  ED Course / Reassessments: Problem List: Influenza 33 year old male presented for flulike symptoms. Initial assessment included history, physical exam,  and review of prior medical records. Given PCR studies resulting in positive Influenza A testing patients symptoms are likely viral in nature. Patient's headache, given no additional neurological symptoms or focal deficits and negative imaging is likely a side effect of current viral illness.  Pain and symptoms were addressed during the visit. Vital signs were obtained and monitored, and the patient remained afebrile, nontachycardic, and in no acute distress throughout the stay.  Patient to be discharged with supportive care measures and strict return precautions if symptoms persist or worsen.  Disposition: Disposition: Discharge with close follow-up PCP for further evaluation and care. Rationale for disposition: Stable for discharge The disposition plan and rationale were discussed with the patient at the bedside, all questions were  addressed, and the patient demonstrated understanding.  This note was produced using Electronics Engineer. While I have reviewed and verified all clinical information, transcription errors may remain.     Final diagnoses:  Influenza A    ED Discharge Orders     None          Willma Duwaine CROME, [place] 10/28/24 0845    Ula Prentice SAUNDERS, MD 10/29/24 1643  "

## 2024-10-24 NOTE — Discharge Instructions (Signed)
 Thank you for visiting the Emergency Department today. It was a pleasure to be part of your healthcare team.  Test results showed that you are positive for flu A. At home, rest, hydrate, resume normal diet, and take Tylenol  and ibuprofen as needed for pain and fever control. It is important to watch for warning signs such as worsening pain, fever, trouble breathing, or chest pain. If any of these happen, return to the Emergency Department or call 911. Please follow up with your primary care provider within 1 to 2 weeks. Thank you for trusting us  with your health.
